# Patient Record
Sex: Female | Born: 1980 | Smoking: Current every day smoker
Health system: Southern US, Community
[De-identification: ages and names within clinical notes are randomized; demographics above are authoritative.]

---

## 2013-03-02 ENCOUNTER — Ambulatory Visit: Payer: Self-pay | Admitting: *Deleted

## 2018-08-31 ENCOUNTER — Emergency Department (HOSPITAL_COMMUNITY)
Admission: EM | Admit: 2018-08-31 | Discharge: 2018-08-31 | Disposition: A | Discharge status: Home / Self Care | Payer: No Typology Code available for payment source | Attending: Emergency Medicine | Admitting: Emergency Medicine

## 2018-08-31 ENCOUNTER — Emergency Department (HOSPITAL_COMMUNITY): Payer: No Typology Code available for payment source

## 2018-08-31 ENCOUNTER — Encounter (HOSPITAL_COMMUNITY): Payer: Self-pay | Admitting: Emergency Medicine

## 2018-08-31 ENCOUNTER — Other Ambulatory Visit: Payer: Self-pay

## 2018-08-31 DIAGNOSIS — Y92415 Exit ramp or entrance ramp of street or highway as the place of occurrence of the external cause: Secondary | ICD-10-CM | POA: Diagnosis not present

## 2018-08-31 DIAGNOSIS — Y999 Unspecified external cause status: Secondary | ICD-10-CM | POA: Insufficient documentation

## 2018-08-31 DIAGNOSIS — Y939 Activity, unspecified: Secondary | ICD-10-CM | POA: Diagnosis not present

## 2018-08-31 DIAGNOSIS — S161XXA Strain of muscle, fascia and tendon at neck level, initial encounter: Secondary | ICD-10-CM | POA: Diagnosis not present

## 2018-08-31 DIAGNOSIS — S199XXA Unspecified injury of neck, initial encounter: Secondary | ICD-10-CM | POA: Diagnosis present

## 2018-08-31 MED ORDER — ACETAMINOPHEN 500 MG PO TABS
1000.0000 mg | ORAL_TABLET | Freq: Once | ORAL | Status: AC
  Administered 2018-08-31: 1000 mg via ORAL
  Filled 2018-08-31: qty 2
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
